# Patient Record
Sex: Male | Born: 1995
Health system: Southern US, Community
[De-identification: ages and names within clinical notes are randomized; demographics above are authoritative.]

## PROBLEM LIST (undated history)

## (undated) DIAGNOSIS — F909 Attention-deficit hyperactivity disorder, unspecified type: Secondary | ICD-10-CM

## (undated) HISTORY — DX: Attention-deficit hyperactivity disorder, unspecified type: F90.9

---
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
## 2015-06-19 ENCOUNTER — Ambulatory Visit (INDEPENDENT_AMBULATORY_CARE_PROVIDER_SITE_OTHER): Payer: 59 | Admitting: Family Medicine

## 2015-06-19 VITALS — BP 134/80 | HR 103 | Temp 97.6°F | Wt 158.7 lb

## 2015-06-19 DIAGNOSIS — R112 Nausea with vomiting, unspecified: Secondary | ICD-10-CM | POA: Diagnosis not present

## 2015-06-19 MED ORDER — PROMETHAZINE HCL 25 MG/ML IJ SOLN
25.0000 mg | Freq: Once | INTRAMUSCULAR | Status: AC
  Administered 2015-06-19: 25 mg via INTRAMUSCULAR

## 2015-06-19 MED ORDER — ONDANSETRON 8 MG PO TBDP: 8.0000 mg | ORAL_TABLET | Freq: Three times a day (TID) | ORAL | Status: DC | PRN

## 2015-06-19 MED ORDER — PROMETHAZINE HCL 25 MG PO TABS: 25.0000 mg | ORAL_TABLET | Freq: Three times a day (TID) | ORAL | Status: DC | PRN

## 2015-06-19 NOTE — Progress Notes (Signed)
Pre visit review using our clinic review tool, if applicable. No additional management support is needed unless otherwise documented below in the visit note. 

## 2015-06-19 NOTE — Progress Notes (Signed)
   Subjective:    Patient ID: Frank Hanson, male    DOB: 1996-01-23, 19 y.o.   MRN: [id number]  HPI Acute visit for nausea and vomiting. Yesterday he noticed some non-bloody diarrhea and this morning around 5 AM woke up with some vomiting with several episodes since then. He's had some diffuse abdominal cramps. Question of low-grade fever initially but not documented by temperature taking. He still feels nauseous at this time. He has kept down only a few sips of fluid. No sick contacts. No localized abdominal pain. No recent travels. Generally very healthy. He does have history of reported migraines and states he had migraine this morning but this is starting to ease up.  Past Medical History  Diagnosis Date  . ADD (attention deficit disorder with hyperactivity)    No past surgical history on file.  reports that he has never smoked. He has never used smokeless tobacco. His alcohol and drug histories are not on file. family history is not on file. Allergies  Allergen Reactions  . Sulfa Antibiotics         Review of Systems  Constitutional: Negative for chills.  Respiratory: Negative for cough.   Gastrointestinal: Positive for nausea, vomiting and diarrhea. Negative for blood in stool.  Neurological: Positive for headaches.       Objective:   Physical Exam  Constitutional: He appears well-developed and well-nourished.  HENT:  Mouth/Throat: Oropharynx is clear and moist.  Cardiovascular: Normal rate and regular rhythm.   Pulmonary/Chest: Effort normal and breath sounds normal. No respiratory distress. He has no wheezes. He has no rales.  Abdominal: Soft. Bowel sounds are normal. He exhibits no distension and no mass. There is no tenderness. There is no rebound and no guarding.  Neurological: He is alert.          Assessment & Plan:  Vomiting and diarrhea. Suspect acute viral gastroenteritis. Phenergan 25 mg IM given. Prescription for Zofran 8 mg oral disintegrating  tablet every 8 hours as needed for nausea and vomiting. Follow-up for any intractable vomiting or other new symptoms

## 2015-06-19 NOTE — Patient Instructions (Signed)

## 2016-09-16 ENCOUNTER — Ambulatory Visit (INDEPENDENT_AMBULATORY_CARE_PROVIDER_SITE_OTHER): Payer: BLUE CROSS/BLUE SHIELD | Admitting: Family Medicine

## 2016-09-16 ENCOUNTER — Encounter: Payer: Self-pay | Admitting: Family Medicine

## 2016-09-16 VITALS — BP 118/78 | HR 93 | Ht 72.0 in | Wt 158.0 lb

## 2016-09-16 DIAGNOSIS — B9789 Other viral agents as the cause of diseases classified elsewhere: Secondary | ICD-10-CM

## 2016-09-16 DIAGNOSIS — J069 Acute upper respiratory infection, unspecified: Secondary | ICD-10-CM

## 2016-09-16 MED ORDER — HYDROCODONE-HOMATROPINE 5-1.5 MG/5ML PO SYRP: 5.0000 mL | ORAL_SOLUTION | Freq: Four times a day (QID) | ORAL | 0 refills | Status: AC | PRN

## 2016-09-16 NOTE — Progress Notes (Signed)
Subjective:     Patient ID: Frank Hanson, male   DOB: 1995-11-15, 21 y.o.   MRN: [id number]  HPI Acute illness. Patient states he was exposed to someone flu last week. He started either late Thursday or Friday of last week with fatigue, body aches, cough, intermittent sweats which were especially bothersome over the weekend. He thinks his fevers are gone at this point. He has severe cough especially at night. Not relieved with over-the-counter medications. No wheezing. He does smoke about one pack cigarettes per week but has not smoked much at all this past week. Cough is dry. No vomiting. Poor appetite.  Past Medical History:  Diagnosis Date  . ADD (attention deficit disorder with hyperactivity)    No past surgical history on file.  reports that he has never smoked. He has never used smokeless tobacco. His alcohol and drug histories are not on file. family history is not on file. Allergies  Allergen Reactions  . Sulfa Antibiotics      Review of Systems  Constitutional: Positive for appetite change, chills and fatigue.  HENT: Positive for congestion.   Respiratory: Positive for cough. Negative for shortness of breath and wheezing.        Objective:   Physical Exam  Constitutional: He appears well-developed and well-nourished.  HENT:  Mouth/Throat: Oropharynx is clear and moist.  Neck: Neck supple.  Cardiovascular: Normal rate and regular rhythm.   Pulmonary/Chest: Effort normal and breath sounds normal. No respiratory distress. He has no wheezes. He has no rales.  Lymphadenopathy:    He has no cervical adenopathy.       Assessment:     Probable viral URI with cough. Possible influenza but past 48 hour window for Tamiflu    Plan:     -Hycodan cough syrup 1 teaspoon daily at bedtime for severe cough -Stay well-hydrated -Follow-up for any recurrent fever or other concerns  Kristian CoveyBruce W Yahia Bottger MD [facility]

## 2016-09-16 NOTE — Patient Instructions (Signed)
Follow up for any recurrent fever or increased shortness of breath. 

## 2016-09-25 ENCOUNTER — Ambulatory Visit (HOSPITAL_COMMUNITY)
Admission: EM | Admit: 2016-09-25 | Discharge: 2016-09-25 | Disposition: A | Discharge status: Home / Self Care | Payer: BLUE CROSS/BLUE SHIELD | Attending: Family Medicine | Admitting: Family Medicine

## 2016-09-25 ENCOUNTER — Ambulatory Visit (INDEPENDENT_AMBULATORY_CARE_PROVIDER_SITE_OTHER): Payer: BLUE CROSS/BLUE SHIELD

## 2016-09-25 ENCOUNTER — Encounter (HOSPITAL_COMMUNITY): Payer: Self-pay | Admitting: Emergency Medicine

## 2016-09-25 DIAGNOSIS — M76899 Other specified enthesopathies of unspecified lower limb, excluding foot: Secondary | ICD-10-CM

## 2016-09-25 DIAGNOSIS — S86912A Strain of unspecified muscle(s) and tendon(s) at lower leg level, left leg, initial encounter: Secondary | ICD-10-CM

## 2016-09-25 DIAGNOSIS — M25562 Pain in left knee: Secondary | ICD-10-CM | POA: Diagnosis not present

## 2016-09-25 DIAGNOSIS — S8992XA Unspecified injury of left lower leg, initial encounter: Secondary | ICD-10-CM | POA: Diagnosis not present

## 2016-09-25 NOTE — ED Provider Notes (Signed)
CSN: [id number]     Arrival date & time 09/25/16  1052 History   First MD Initiated Contact with Patient 09/25/16 1229     Chief Complaint  Patient presents with  . Leg Injury   (Consider location/radiation/quality/duration/timing/severity/associated sxs/prior Treatment) 21 year old male states he was wrestling with a friend last night and while he was in the midst of a take down his left foot was plantar for his body twisted to the left injuring his knee during this particular action. He is complaining of pain in the knee primarily in the posterior aspect and medial aspect. He states the mechanism of action was primarily twisting or torsion to the left. He denies falling onto or striking the knee. He has pain with weightbearing and attempts at ambulation.      Past Medical History:  Diagnosis Date  . ADD (attention deficit disorder with hyperactivity)    History reviewed. No pertinent surgical history. History reviewed. No pertinent family history. Social History  Substance Use Topics  . Smoking status: Never Smoker  . Smokeless tobacco: Never Used  . Alcohol use Not on file    Review of Systems  Constitutional: Negative.   Respiratory: Negative.   Gastrointestinal: Negative.   Genitourinary: Negative.   Musculoskeletal: Positive for arthralgias and gait problem. Negative for joint swelling.       As per HPI  Skin: Negative.   Neurological: Negative for dizziness, weakness, numbness and headaches.    Allergies  Apple and Sulfa antibiotics  Home Medications   Prior to Admission medications   Medication Sig Start Date End Date Taking? Authorizing Provider  amphetamine-dextroamphetamine (ADDERALL XR) 30 MG 24 hr capsule Take 1 capsule (30 mg total) by mouth every morning. 04/05/12   Kristian CoveyBruce W Burchette, MD  amphetamine-dextroamphetamine (ADDERALL XR) 30 MG 24 hr capsule Take 1 capsule (30 mg total) by mouth every morning. May refill in 2 months 04/05/12   Kristian CoveyBruce W Burchette, MD   amphetamine-dextroamphetamine (ADDERALL XR) 30 MG 24 hr capsule Take 1 capsule (30 mg total) by mouth every morning. May refill in one month 04/05/12   Kristian CoveyBruce W Burchette, MD  HYDROcodone-homatropine [facility](HYCODAN) 5-1.5 MG/5ML syrup Take 5 mLs by mouth every 6 (six) hours as needed. 09/16/16 09/26/16  Kristian CoveyBruce W Burchette, MD   Meds Ordered and Administered this Visit  Medications - No data to display  BP (!) 100/54 (BP Location: Left Arm) Comment: reports he took a beta blocker due to using cocaine last night  Pulse 80   Temp 98.3 F (36.8 C) (Oral)   Resp 16   SpO2 99%  No data found.   Physical Exam  Constitutional: He is oriented to person, place, and time. He appears well-developed and well-nourished. No distress.  HENT:  Head: Normocephalic and atraumatic.  Eyes: EOM are normal. Left eye exhibits no discharge.  Neck: Normal range of motion. Neck supple.  Musculoskeletal: He exhibits tenderness. He exhibits no edema or deformity.  Left knee without apparent edema. There is tenderness to both medial and lateral hamstring tendons. This appears to be the area of greatest tenderness. There is also tenderness to the superficial tendons of the distal femur and proximal tibia on the medial aspect. Flexion to 90 with minimal discomfort. He is able to extend to 180 but with pain behind the knee involving the hamstring tendons. Attempts at varus and valgus forces produces pain but no laxity. Negative drawer. No tenderness along the anterior joint line.  Neurological: He is alert and oriented to  person, place, and time. No cranial nerve deficit.  Skin: Skin is warm and dry.  Psychiatric: He has a normal mood and affect.  Nursing note and vitals reviewed.   Urgent Care Course     Procedures (including critical care time)  Labs Review Labs Reviewed - No data to display  Imaging Review Dg Knee Complete 4 Views Left  Result Date: 09/25/2016 CLINICAL DATA:  Pain after trauma EXAM: LEFT KNEE -  COMPLETE 4+ VIEW COMPARISON:  None. FINDINGS: No evidence of fracture, dislocation, or joint effusion. No evidence of arthropathy or other focal bone abnormality. Soft tissues are unremarkable. IMPRESSION: Negative. Electronically Signed   By: Gerome Sam III M.D   On: 09/25/2016 12:23     Visual Acuity Review  Right Eye Distance:   Left Eye Distance:   Bilateral Distance:    Right Eye Near:   Left Eye Near:    Bilateral Near:         MDM   1. Knee strain, left, initial encounter   2. Hamstring tendonitis    Examination suggest this is more of a tendon injury and strain more so then a ligament injury. Apply ice to the areas of soreness and over the hamstring tendon. Wear the knee brace over the next 3-5 days. Use crutches for the first 2 or 3 days and then start gradually bearing weight as tolerated. You may remove the knee brace periodically and perform small extensions and flexions of the knee for range of motion. If she continues to have pain, limited mobility and weightbearing toward the middle of the week follow-up with the on-call orthopedist listed on this page.   knee immobilizer and crutches Hayden Rasmussen, NP 09/25/16 1311    Hayden Rasmussen, NP 09/25/16 1314    Hayden Rasmussen, NP 09/25/16 1316

## 2016-09-25 NOTE — ED Triage Notes (Addendum)
Pt reports he inj his left leg/knee last night while play fighting w/friends  Reports he twisted left knee... Reports friends hear a pop Has abrasion to LLE  BP today is 100/54 -- reports he took a beta blocker his friend gave him today at 0800 due to using cocaine last night  Slow gait... A&O x4... NAD

## 2016-09-25 NOTE — Discharge Instructions (Signed)
Examination suggest this is more of a tendon injury and strain more so then a ligament injury. Apply ice to the areas of soreness and over the hamstring tendon. Wear the knee brace over the next 3-5 days. Use crutches for the first 2 or 3 days and then start gradually bearing weight as tolerated. You may remove the knee brace periodically and perform small extensions and flexions of the knee for range of motion. If she continues to have pain, limited mobility and weightbearing toward the middle of the week follow-up with the on-call orthopedist listed on this page.

## 2017-01-13 DIAGNOSIS — F4325 Adjustment disorder with mixed disturbance of emotions and conduct: Secondary | ICD-10-CM | POA: Diagnosis not present

## 2017-02-08 DIAGNOSIS — F4325 Adjustment disorder with mixed disturbance of emotions and conduct: Secondary | ICD-10-CM | POA: Diagnosis not present

## 2017-03-22 DIAGNOSIS — H5213 Myopia, bilateral: Secondary | ICD-10-CM | POA: Diagnosis not present

## 2017-04-13 ENCOUNTER — Encounter (HOSPITAL_COMMUNITY): Payer: Self-pay | Admitting: Emergency Medicine

## 2017-04-13 ENCOUNTER — Emergency Department (HOSPITAL_COMMUNITY): Payer: BLUE CROSS/BLUE SHIELD

## 2017-04-13 ENCOUNTER — Emergency Department (HOSPITAL_COMMUNITY)
Admission: EM | Admit: 2017-04-13 | Discharge: 2017-04-13 | Disposition: A | Discharge status: Home / Self Care | Payer: BLUE CROSS/BLUE SHIELD | Attending: Emergency Medicine | Admitting: Emergency Medicine

## 2017-04-13 DIAGNOSIS — Z79899 Other long term (current) drug therapy: Secondary | ICD-10-CM | POA: Diagnosis not present

## 2017-04-13 DIAGNOSIS — G4489 Other headache syndrome: Secondary | ICD-10-CM | POA: Diagnosis not present

## 2017-04-13 DIAGNOSIS — R40241 Glasgow coma scale score 13-15, unspecified time: Secondary | ICD-10-CM | POA: Insufficient documentation

## 2017-04-13 DIAGNOSIS — R51 Headache: Secondary | ICD-10-CM | POA: Insufficient documentation

## 2017-04-13 DIAGNOSIS — R519 Headache, unspecified: Secondary | ICD-10-CM

## 2017-04-13 DIAGNOSIS — R52 Pain, unspecified: Secondary | ICD-10-CM | POA: Diagnosis not present

## 2017-04-13 NOTE — Discharge Instructions (Signed)
Please read attached information. If you experience any new or worsening signs or symptoms please return to the emergency room for evaluation. Please follow-up with your primary care provider or specialist as discussed.  °

## 2017-04-13 NOTE — ED Provider Notes (Signed)
[facility]-EMERGENCY DEPT Provider Note   CSN: [id number] Arrival date & time: 04/13/17  1145   History   Chief Complaint Chief Complaint  Patient presents with  . Headache    HPI Frank Hanson is a 21 y.o. male.  HPI   21 year old male presents to complaints of headache. Patient is [place] [facility] [place] Patient is currently going through alcohol counseling. Patient notes that he was in the shower today when he had onset of headache. Patient notes the headache lasted for several minutes then Slowly started to resolve. He reports that he is near baseline at this point with no significant pain. Patient reports he did not take any medications with this. He denies any previous trauma, denies any neurological complaints both during the headache and now. Patient reports he is otherwise healthy. Patient does note he recently started Zoloft 3 days ago.    Past Medical History:  Diagnosis Date  . ADD (attention deficit disorder with hyperactivity)     Patient Active Problem List   Diagnosis Date Noted  . ADD (attention deficit disorder) 02/22/2012    History reviewed. No pertinent surgical history.     Home Medications    Prior to Admission medications   Medication Sig Start Date End Date Taking? Authorizing Provider  amphetamine-dextroamphetamine (ADDERALL XR) 30 MG 24 hr capsule Take 1 capsule (30 mg total) by mouth every morning. 04/05/12   Burchette, Elberta Fortis, MD  amphetamine-dextroamphetamine (ADDERALL XR) 30 MG 24 hr capsule Take 1 capsule (30 mg total) by mouth every morning. May refill in 2 months 04/05/12   Kristian Covey, MD  amphetamine-dextroamphetamine (ADDERALL XR) 30 MG 24 hr capsule Take 1 capsule (30 mg total) by mouth every morning. May refill in one month 04/05/12   Burchette, Elberta Fortis, MD    Family History History reviewed. No pertinent family history.  Social History Social History  Substance Use Topics  . Smoking status: Never Smoker  . Smokeless tobacco:  Never Used  . Alcohol use Not on file     Allergies   Apple and Sulfa antibiotics   Review of Systems Review of Systems  All other systems reviewed and are negative.   Physical Exam Updated Vital Signs BP (!) 119/55 (BP Location: Left Arm)   Pulse (!) 53   Temp 98.3 F (36.8 C) (Oral)   Resp 16   Ht 6\' 2"  (1.88 m)   Wt 79.4 kg (175 lb)   SpO2 95%   BMI 22.47 kg/m   Physical Exam  Constitutional: He is oriented to person, place, and time. He appears well-developed and well-nourished. No distress.  HENT:  Head: Normocephalic.  Eyes: Pupils are equal, round, and reactive to light. Conjunctivae and EOM are normal. Right eye exhibits no discharge. Left eye exhibits no discharge. No scleral icterus.  Neck: Normal range of motion. Neck supple. No JVD present.  Pulmonary/Chest: No stridor.  Musculoskeletal: Normal range of motion. He exhibits no edema or tenderness.  Lymphadenopathy:    He has no cervical adenopathy.  Neurological: He is alert and oriented to person, place, and time. He has normal strength. He displays no atrophy and no tremor. No cranial nerve deficit or sensory deficit. He exhibits normal muscle tone. He displays a negative Romberg sign. He displays no seizure activity. Coordination and gait normal. GCS eye subscore is 4. GCS verbal subscore is 5. GCS motor subscore is 6.  Reflex Scores:      Patellar reflexes are 2+ on the right side  and 2+ on the left side. Skin: He is not diaphoretic.  Nursing note and vitals reviewed.    ED Treatments / Results  Labs (all labs ordered are listed, but only abnormal results are displayed) Labs Reviewed - No data to display  EKG  EKG Interpretation None       Radiology Ct Head Wo Contrast  Result Date: 04/13/2017 CLINICAL DATA:  Right side headache. EXAM: CT HEAD WITHOUT CONTRAST TECHNIQUE: Contiguous axial images were obtained from the base of the skull through the vertex without intravenous contrast.  COMPARISON:  None FINDINGS: Brain: No acute intracranial abnormality. Specifically, no hemorrhage, hydrocephalus, mass lesion, acute infarction, or significant intracranial injury. Vascular: No hyperdense vessel or unexpected calcification. Skull: No acute calvarial abnormality. Sinuses/Orbits: Visualized paranasal sinuses and mastoids clear. Orbital soft tissues unremarkable. Other: None IMPRESSION: Normal study Electronically Signed   By: Charlett Nose M.D.   On: 04/13/2017 14:41    Procedures Procedures (including critical care time)  Medications Ordered in ED Medications - No data to display   Initial Impression / Assessment and Plan / ED Course  I have reviewed the triage vital signs and the nursing notes.  Pertinent labs & imaging results that were available during my care of the patient were reviewed by me and considered in my medical decision making (see chart for details).     Final Clinical Impressions(s) / ED Diagnoses   Final diagnoses:  Acute nonintractable headache, unspecified headache type    Labs:   Imaging:CT head without  Consults:  Therapeutics:  Discharge Meds:   Assessment/Plan: 21 year old male presents today with headache. Patient has near complete resolution of symptoms. Patient has normal head CT, low suspicion for significant intracranial abnormality. Patient will be referred to neurology if headaches continue persist, he is given strict return precautions if they worsen. Patient verbalized understanding and agreement to today's plan had no further questions or concerns at time of discharge.      New Prescriptions New Prescriptions   No medications on file     Rosalio Loud 04/13/17 1504    Geoffery Lyons, MD 04/13/17 [phone]

## 2017-04-13 NOTE — ED Triage Notes (Signed)
Pt sts right sided HA starting this am; pt from [facility] for ETOH x 3 weeks

## 2017-11-23 ENCOUNTER — Ambulatory Visit (HOSPITAL_COMMUNITY)
Admission: EM | Admit: 2017-11-23 | Discharge: 2017-11-23 | Disposition: A | Discharge status: Home / Self Care | Payer: BLUE CROSS/BLUE SHIELD | Attending: Family Medicine | Admitting: Family Medicine

## 2017-11-23 ENCOUNTER — Other Ambulatory Visit: Payer: Self-pay

## 2017-11-23 ENCOUNTER — Encounter (HOSPITAL_COMMUNITY): Payer: Self-pay | Admitting: Emergency Medicine

## 2017-11-23 DIAGNOSIS — R112 Nausea with vomiting, unspecified: Secondary | ICD-10-CM | POA: Diagnosis not present

## 2017-11-23 DIAGNOSIS — R197 Diarrhea, unspecified: Secondary | ICD-10-CM

## 2017-11-23 MED ORDER — ONDANSETRON HCL 4 MG/2ML IJ SOLN
4.0000 mg | Freq: Once | INTRAMUSCULAR | Status: AC
  Administered 2017-11-23: 4 mg via INTRAVENOUS

## 2017-11-23 MED ORDER — ONDANSETRON 4 MG PO TBDP: 4.0000 mg | ORAL_TABLET | Freq: Three times a day (TID) | ORAL | 0 refills | Status: AC | PRN

## 2017-11-23 MED ORDER — SODIUM CHLORIDE 0.9 % IV BOLUS
1000.0000 mL | Freq: Once | INTRAVENOUS | Status: AC
  Administered 2017-11-23: 1000 mL via INTRAVENOUS

## 2017-11-23 MED ORDER — ONDANSETRON 4 MG PO TBDP: 4.0000 mg | ORAL_TABLET | Freq: Once | ORAL | Status: DC

## 2017-11-23 MED ORDER — ONDANSETRON HCL 4 MG/2ML IJ SOLN
INTRAMUSCULAR | Status: AC
  Filled 2017-11-23: qty 2

## 2017-11-23 NOTE — ED Triage Notes (Signed)
C/o vomiting and diarrhea onset this AM

## 2017-11-23 NOTE — ED Provider Notes (Signed)
[facility]    CSN: [id number] Arrival date & time: 11/23/17  4098     History   Chief Complaint Chief Complaint  Patient presents with  . Diarrhea    HPI Frank Hanson is a 22 y.o. male.   22 year old male comes in for nausea, vomiting, diarrhea onset this morning. Has had 3 episodes of non bilious, nonbloody emesis. 5 episodes of watery diarrhea. Denies blood in stool. Low abdominal pain that is constant, waxes and wanes. States "I don't know" when asked about characteristic of the pain and aggravating/alleviating factor. Denies urinary symptoms such as frequency, dysuria, hematuria. Denies fever, chills, night sweats. Denies URI symptoms such as cough, congestion, rhinorrhea. States weak and lightheaded, denies syncope.      Past Medical History:  Diagnosis Date  . ADD (attention deficit disorder with hyperactivity)     Patient Active Problem List   Diagnosis Date Noted  . ADD (attention deficit disorder) 02/22/2012    History reviewed. No pertinent surgical history.     Home Medications    Prior to Admission medications   Medication Sig Start Date End Date Taking? Authorizing Provider  amphetamine-dextroamphetamine (ADDERALL XR) 30 MG 24 hr capsule Take 1 capsule (30 mg total) by mouth every morning. 04/05/12   Burchette, Elberta Fortis, MD  amphetamine-dextroamphetamine (ADDERALL XR) 30 MG 24 hr capsule Take 1 capsule (30 mg total) by mouth every morning. May refill in 2 months 04/05/12   Kristian Covey, MD  amphetamine-dextroamphetamine (ADDERALL XR) 30 MG 24 hr capsule Take 1 capsule (30 mg total) by mouth every morning. May refill in one month 04/05/12   Burchette, Elberta Fortis, MD  ondansetron (ZOFRAN ODT) 4 MG disintegrating tablet Take 1 tablet (4 mg total) by mouth every 8 (eight) hours as needed for nausea or vomiting. 11/23/17   Belinda Fisher, PA-C    Family History No family history on file.  Social History Social History   Tobacco Use  . Smoking  status: Never Smoker  . Smokeless tobacco: Never Used  Substance Use Topics  . Alcohol use: Not on file  . Drug use: Not on file     Allergies   Apple and Sulfa antibiotics   Review of Systems Review of Systems  Reason unable to perform ROS: See HPI as above.     Physical Exam Triage Vital Signs ED Triage Vitals  Enc Vitals Group     BP 11/23/17 1856 (!) 118/93     Pulse Rate 11/23/17 1856 (!) 112     Resp --      Temp 11/23/17 1856 97.8 F (36.6 C)     Temp Source 11/23/17 1856 Oral     SpO2 11/23/17 1856 100 %     Weight --      Height --      Head Circumference --      Peak Flow --      Pain Score 11/23/17 1854 7     Pain Loc --      Pain Edu? --      Excl. in GC? --    No data found.  Updated Vital Signs BP 121/67 (BP Location: Left Arm)   Pulse 100   Temp 99.5 F (37.5 C) (Oral)   Resp 18   SpO2 99%   Physical Exam  Constitutional: He is oriented to person, place, and time. He appears well-developed and well-nourished.  Patient looks uncomfortable, but without diaphoresis, nontoxic in appearance.  HENT:  Head: Normocephalic and atraumatic.  Cardiovascular: Regular rhythm and normal heart sounds. Tachycardia present. Exam reveals no gallop and no friction rub.  No murmur heard. Pulmonary/Chest: Effort normal and breath sounds normal. He has no wheezes. He has no rales.  Abdominal: Soft. He exhibits no mass. Bowel sounds are increased. There is no tenderness. There is no rigidity, no rebound, no guarding and no CVA tenderness.  Neurological: He is alert and oriented to person, place, and time.  Skin: Skin is warm and dry.  Psychiatric: He has a normal mood and affect. His behavior is normal. Judgment normal.     UC Treatments / Results  Labs (all labs ordered are listed, but only abnormal results are displayed) Labs Reviewed - No data to display  EKG None Radiology No results found.  Procedures Procedures (including critical care  time)  Medications Ordered in UC Medications  sodium chloride 0.9 % bolus 1,000 mL (0 mLs Intravenous Stopped 11/23/17 2010)  ondansetron (ZOFRAN) injection 4 mg (4 mg Intravenous Given 11/23/17 1932)     Initial Impression / Assessment and Plan / UC Course  I have reviewed the triage vital signs and the nursing notes.  Pertinent labs & imaging results that were available during my care of the patient were reviewed by me and considered in my medical decision making (see chart for details).    Patient with much improved symptoms after 1 L bolus.  No longer nauseous.  Will send patient home with Zofran. Push fluids. Bland diet, advance as tolerated. Return precautions given. Patient expresses understanding and agrees to plan.    Final Clinical Impressions(s) / UC Diagnoses   Final diagnoses:  Nausea vomiting and diarrhea    ED Discharge Orders        Ordered    ondansetron (ZOFRAN ODT) 4 MG disintegrating tablet  Every 8 hours PRN     11/23/17 2009        Althea Backs V, [place]PA-C 11/24/17 [id number]

## 2017-11-23 NOTE — Discharge Instructions (Signed)
Zofran for nausea and vomiting as needed. Keep hydrated, you urine should be clear to pale yellow in color. Bland diet as attached, advance as tolerated. Monitor for any worsening of symptoms, nausea or vomiting not controlled by medication, worsening abdominal pain, fever, follow-up for reevaluation. ° °

## 2018-03-02 DIAGNOSIS — H40013 Open angle with borderline findings, low risk, bilateral: Secondary | ICD-10-CM | POA: Diagnosis not present

## 2018-10-06 IMAGING — CT CT HEAD W/O CM
4 series · 16 of 47 positions shown, 18 images · non-contrast
Comparison: None

CLINICAL DATA: Right side headache.

EXAM:
CT HEAD WITHOUT CONTRAST
TECHNIQUE: Contiguous axial images were obtained from the base of the skull
through the vertex without intravenous contrast.

[Series 3: head without · axial · non-contrast · 0.45mm/px · z∈[-112,+13]mm · 7 of 35 slices shown, 9 images]
[im 5/35  brain]
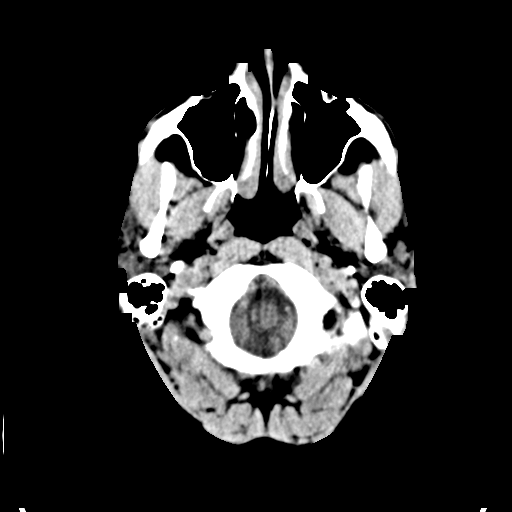
[im 5/35  bone]
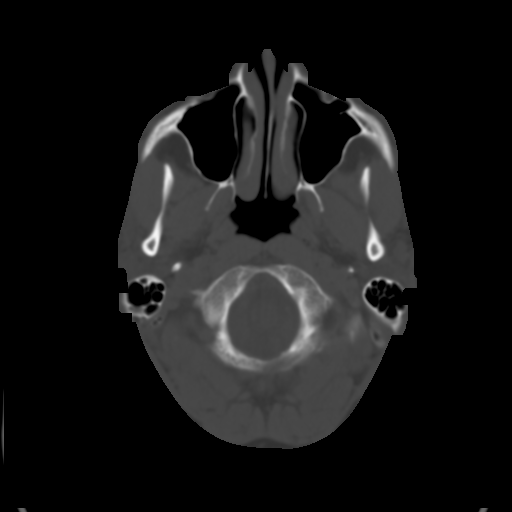
[im 9/35  brain]
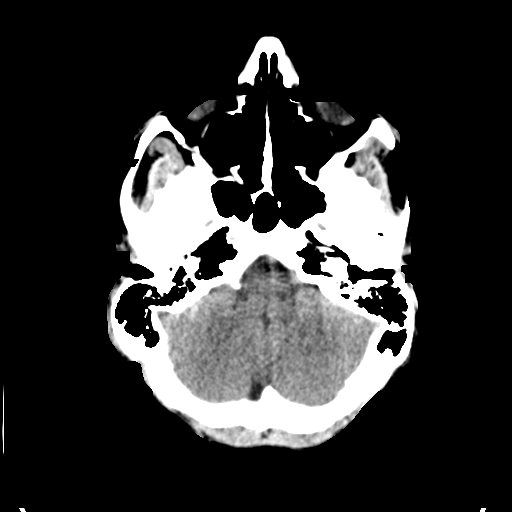
[im 13/35  brain]
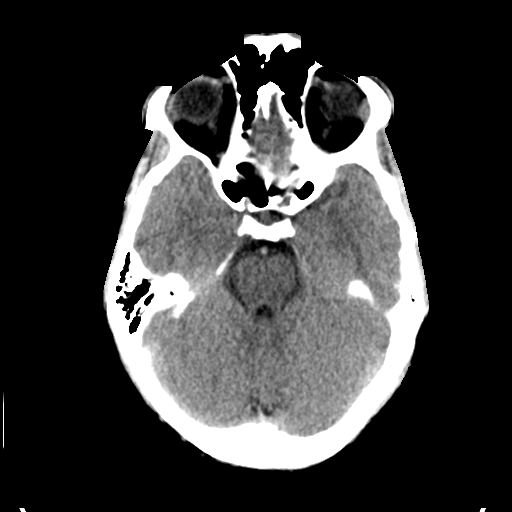
[im 18/35  brain]
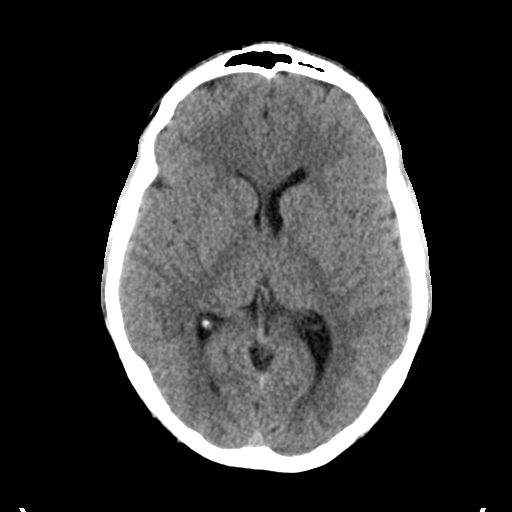
[im 22/35  brain]
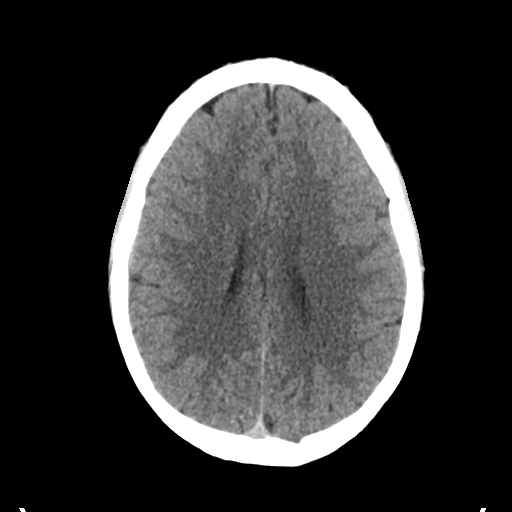
[im 22/35  bone]
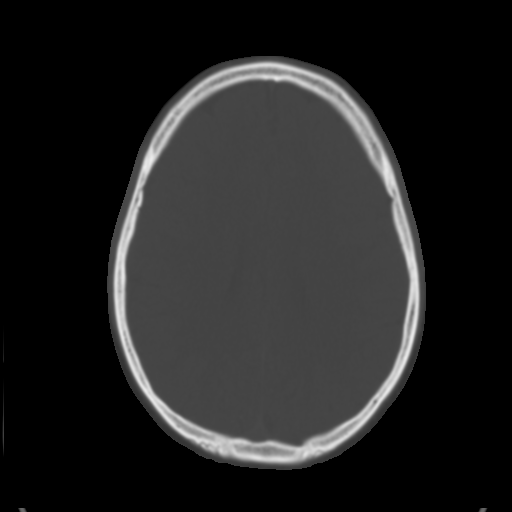
[im 26/35  brain]
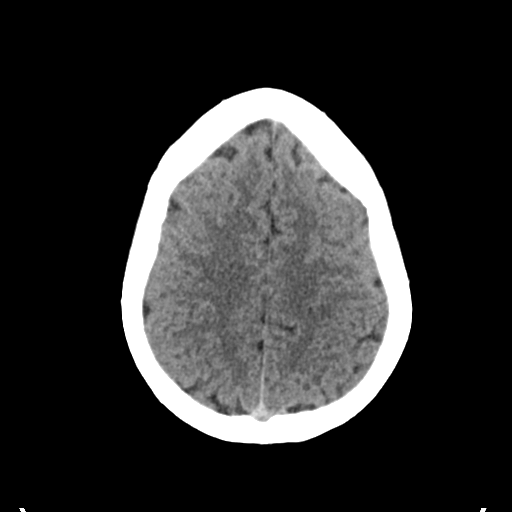
[im 30/35  brain]
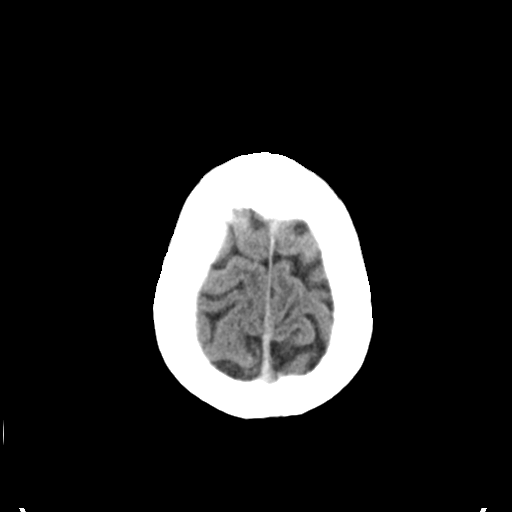

[Series 4: head bone · axial · 0.45mm/px · z∈[-116,-82]mm · 3 of 87 slices shown]
[im 9/87  bone]
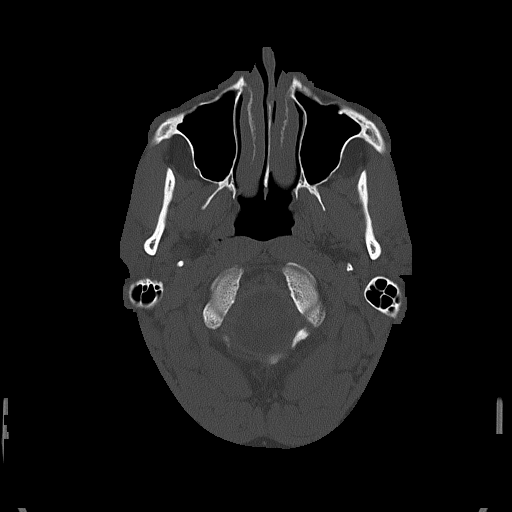
[im 18/87  bone]
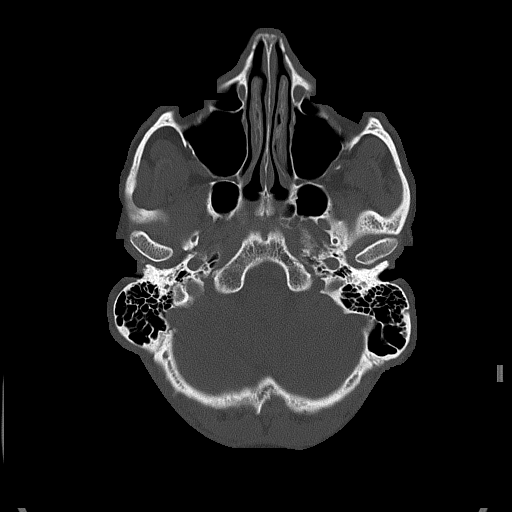
[im 26/87  bone]
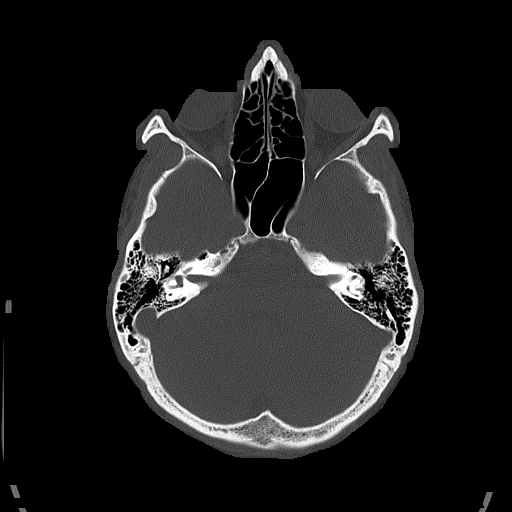

[Series 5: head without cor · coronal · non-contrast · 0.34mm/px · 3 of 75 slices shown]
[im 25/75  brain]
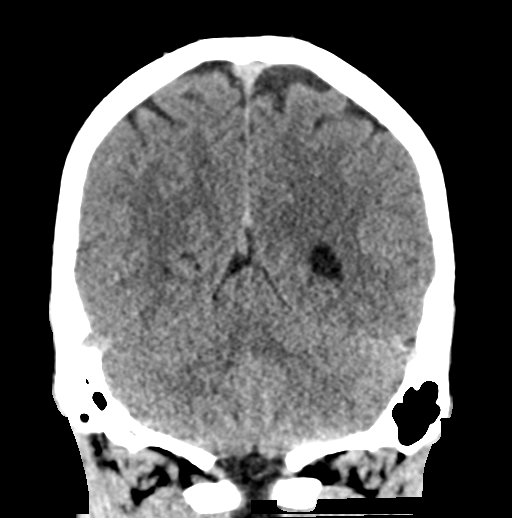
[im 33/75  brain]
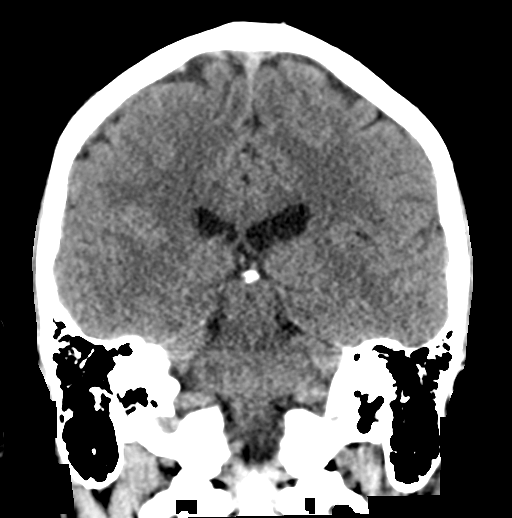
[im 42/75  brain]
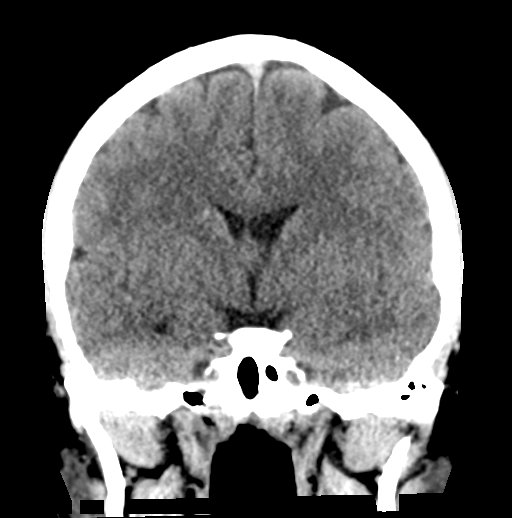

[Series 6: head without sag · sagittal · non-contrast · 0.34mm/px · 3 of 58 slices shown]
[im 20/58  brain]
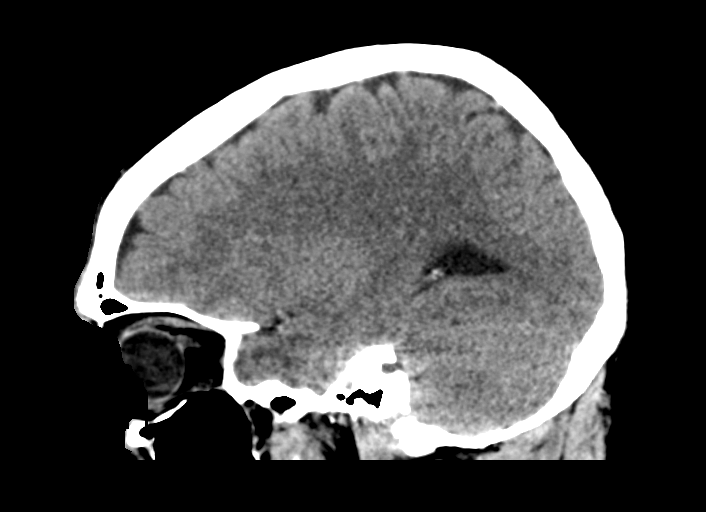
[im 29/58  brain]
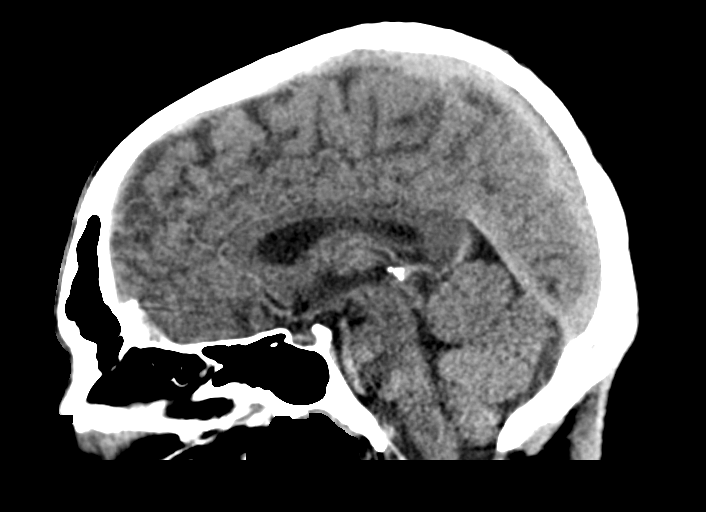
[im 39/58  brain]
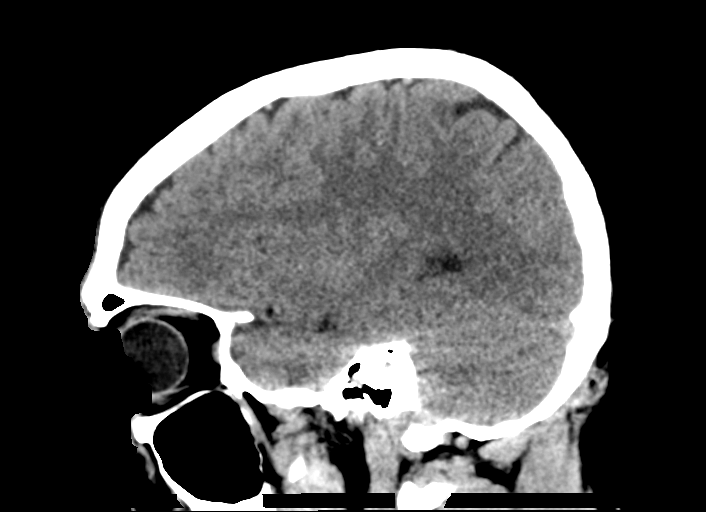

[16 of 47 positions shown; findings below may reference images not displayed]

FINDINGS: Brain: No acute intracranial abnormality. Specifically, no
hemorrhage, hydrocephalus, mass lesion, acute infarction, or
significant intracranial injury.

Vascular: No hyperdense vessel or unexpected calcification.

Skull: No acute calvarial abnormality.

Sinuses/Orbits: Visualized paranasal sinuses and mastoids clear.
Orbital soft tissues unremarkable.

Other: None
IMPRESSION: Normal study

## 2023-09-28 ENCOUNTER — Ambulatory Visit: Payer: Self-pay | Admitting: *Deleted

## 2023-09-28 NOTE — Telephone Encounter (Signed)
  Chief Complaint: dry cough, burning in chest Symptoms: dry cough, burning in chest wheezing after coughing at times. Taking delsyum and drinking warm liquids with honey. Not helping . Coughing spells cause vomiting at times.  Frequency: Saturday  Pertinent Negatives: Patient denies chest pain no difficulty breathing no fever has not tested for covid  Disposition: [] ED /[x] Urgent Care (no appt availability in office) / [] Appointment(In office/virtual)/ []  [facility]/ [] Home Care/ [] Refused Recommended Disposition /[] [facility]/ []  Follow-up with PCP Additional Notes:   Last OV 2018 with Dr. Micheal at [facility].  Patient will need new patient appt. Recommended patient go to UC or mobile bus and  provided address.  Unsure of disposition. Recommended patient can call back to schedule new patient appt since not scheduling today .      Copied from CRM [id number]. Topic: Clinical - Red Word Triage >> Sep 28, 2023 12:21 PM Frank Hanson wrote: Red Word that prompted transfer to Nurse Triage: Patient stated he's been sick uncontrollable cough until he cannot breathe, burning in chest. Reason for Disposition  SEVERE coughing spells (e.g., whooping sound after coughing, vomiting after coughing)  Answer Assessment - Initial Assessment Questions 1. ONSET: When did the cough begin?      Saturday  2. SEVERITY: How bad is the cough today?      Wheezing after coughing  3. SPUTUM: Describe the color of your sputum (none, dry cough; clear, white, yellow, green)     Dry  4. HEMOPTYSIS: Are you coughing up any blood? If so ask: How much? (flecks, streaks, tablespoons, etc.)     na 5. DIFFICULTY BREATHING: Are you having difficulty breathing? If Yes, ask: How bad is it? (e.g., mild, moderate, severe)    - MILD: No SOB at rest, mild SOB with walking, speaks normally in sentences, can lie down, no retractions, pulse < 100.    - MODERATE: SOB at rest, SOB with minimal  exertion and prefers to sit, cannot lie down flat, speaks in phrases, mild retractions, audible wheezing, pulse 100-120.    - SEVERE: Very SOB at rest, speaks in single words, struggling to breathe, sitting hunched forward, retractions, pulse > 120      Denies SOB cough worse with laying flat  6. FEVER: Do you have a fever? If Yes, ask: What is your temperature, how was it measured, and when did it start?     na 7. CARDIAC HISTORY: Do you have any history of heart disease? (e.g., heart attack, congestive heart failure)      na 8. LUNG HISTORY: Do you have any history of lung disease?  (e.g., pulmonary embolus, asthma, emphysema)     na 9. PE RISK FACTORS: Do you have a history of blood clots? (or: recent major surgery, recent prolonged travel, bedridden)     na 10. OTHER SYMPTOMS: Do you have any other symptoms? (e.g., runny nose, wheezing, chest pain)       Cough dry   coughing spells causing vomiting at times. Burning in chest.  11. PREGNANCY: Is there any chance you are pregnant? When was your last menstrual period?       na 12. TRAVEL: Have you traveled out of the country in the last month? (e.g., travel history, exposures)       na  Protocols used: Cough - Acute Non-Productive-A-AH
